# Patient Record
Sex: Male | Born: 1995 | Race: White | Hispanic: No | Marital: Single | Smoking: Never smoker
Health system: Southern US, Community
[De-identification: ages and names within clinical notes are randomized; demographics above are authoritative.]

---
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
## 2017-08-21 IMAGING — CR DG CLAVICLE*R*
2 series · 2 of 2 positions shown · non-contrast
Comparison: None.

CLINICAL DATA: Fell during martial arts practice 1 day prior

EXAM:
RIGHT CLAVICLE - 2+ VIEWS

[clavicle ap]
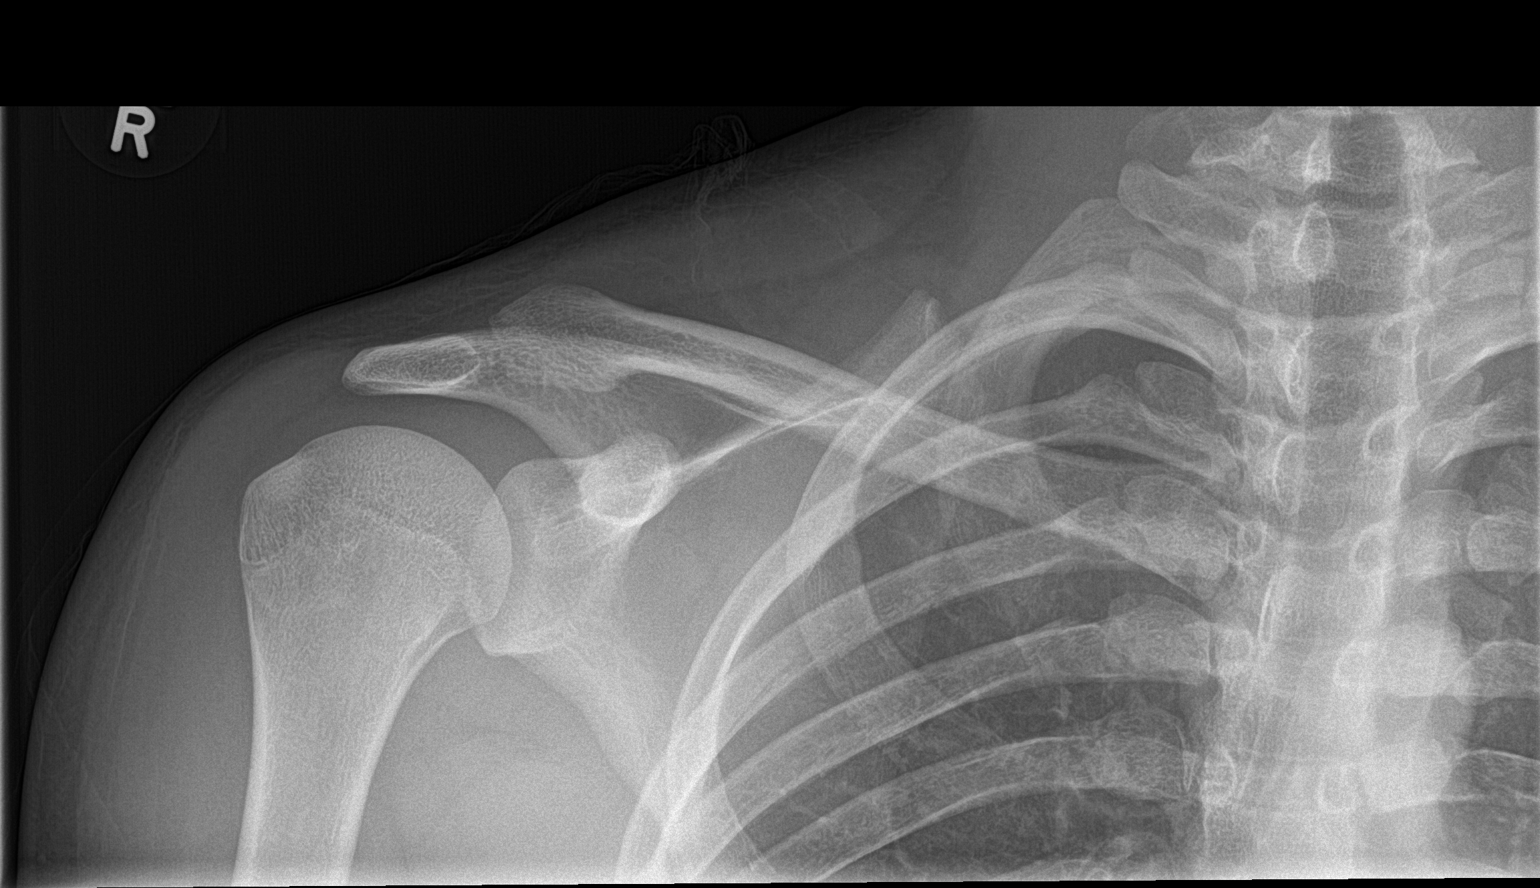

[clavicle axial]
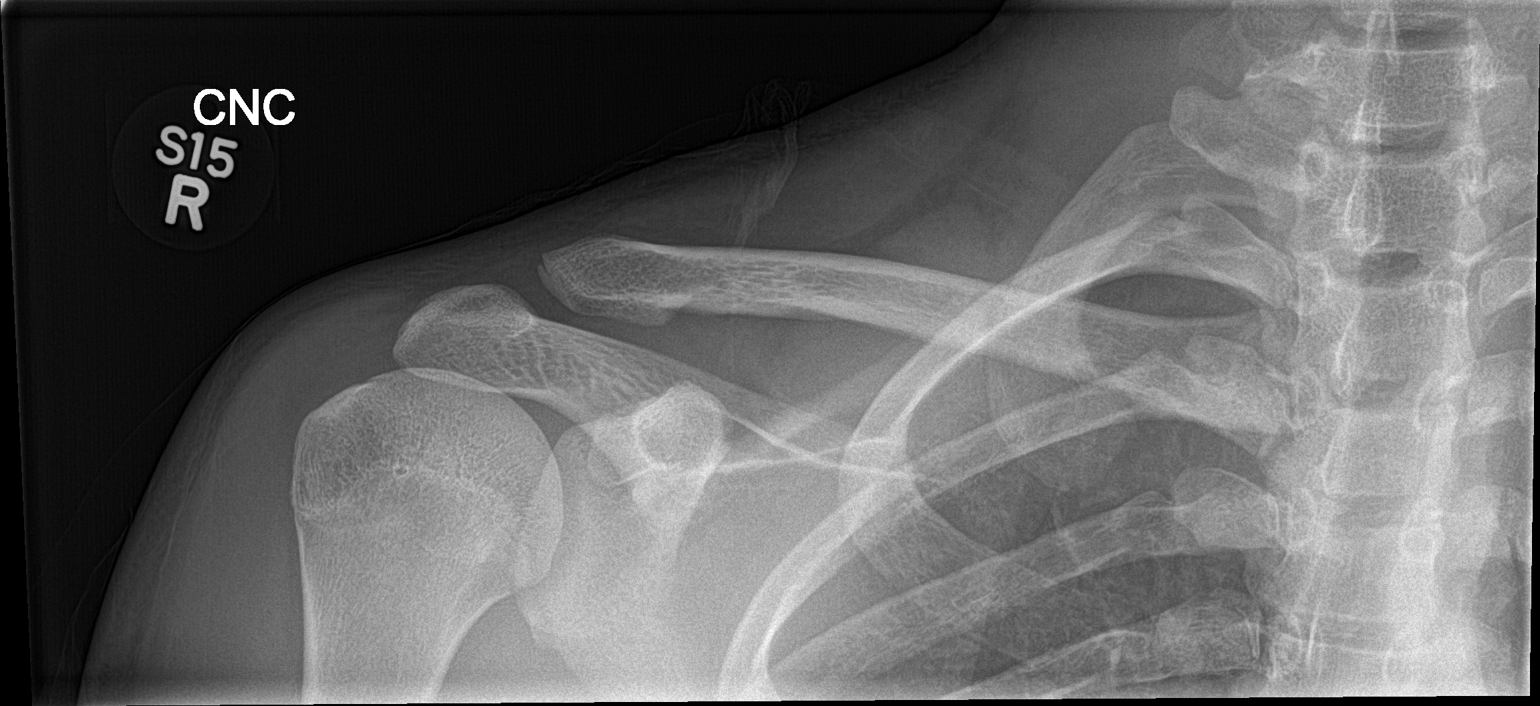

[2 of 2 positions shown; findings below may reference images not displayed]

FINDINGS: Frontal and tilt frontal images were obtained. There is no
demonstrable fracture or dislocation. No apparent arthropathy.
Visualized right lung clear.
IMPRESSION: No demonstrable fracture or dislocation. No appreciable arthropathic
change.

## 2017-10-14 ENCOUNTER — Emergency Department
Admission: EM | Admit: 2017-10-14 | Discharge: 2017-10-14 | Disposition: A | Discharge status: Home / Self Care | Payer: Self-pay | Attending: Emergency Medicine | Admitting: Emergency Medicine

## 2017-10-14 ENCOUNTER — Emergency Department: Payer: Self-pay

## 2017-10-14 ENCOUNTER — Other Ambulatory Visit: Payer: Self-pay

## 2017-10-14 DIAGNOSIS — Z79899 Other long term (current) drug therapy: Secondary | ICD-10-CM | POA: Insufficient documentation

## 2017-10-14 DIAGNOSIS — F17220 Nicotine dependence, chewing tobacco, uncomplicated: Secondary | ICD-10-CM | POA: Insufficient documentation

## 2017-10-14 DIAGNOSIS — R079 Chest pain, unspecified: Secondary | ICD-10-CM | POA: Insufficient documentation

## 2017-10-14 MED ORDER — MELOXICAM 15 MG PO TABS: 15.0000 mg | ORAL_TABLET | Freq: Every day | ORAL | 0 refills | Status: AC

## 2017-10-14 NOTE — ED Triage Notes (Signed)
Patient reports having chest pain off/on for the past 10 years, worse this time over the past few days.

## 2017-10-14 NOTE — ED Provider Notes (Signed)
[facility] Emergency Department Provider Note  ____________________________________________  Time seen: Approximately 9:42 PM  I have reviewed the triage vital signs and the nursing notes.   HISTORY  Chief Complaint Chest Pain    HPI Douglas Navarro is a 22 y.o. male who presents the emergency department complaining of chest pain.  Patient reports that he has had these intermittent episodes of chest pain for the past 10 years.  Patient reports that he has never seen a cardiologist for same.  They typically last for brief, short period of time and then resolve.  Patient was concerned as the episode today did not resolve as rapidly as previous episodes.  Patient reports that pain is all located on the left side of his chest.  Patient has had a recent URI denies any other recent illnesses.  No chronic medical problems.  No familial history of cardiac history.  Patient denies any history of murmurs, VSD or ASD, valvular issues.  No history of pericarditis or endocarditis.  No drug use.  Patient denies any other complaint of headache, visual changes, shortness of breath, abdominal pain, nausea vomiting, diarrhea constipation.  No medications for this complaint prior to arrival.  Patient is not currently experiencing chest pain.  No past medical history on file.  There are no active problems to display for this patient.   No past surgical history on file.  Prior to Admission medications   Medication Sig Start Date End Date Taking? Authorizing Provider  meloxicam (MOBIC) 15 MG tablet Take 1 tablet (15 mg total) by mouth daily. 10/14/17   Elea Holtzclaw, Delorise Royals, PA-C  naproxen (NAPROSYN) 500 MG tablet Take 1 tablet (500 mg total) by mouth 2 (two) times daily with a meal. 08/26/15   Joni Reining, PA-C    Allergies Patient has no known allergies.  No family history on file.  Social History Social History   Tobacco Use  . Smoking status: Never Smoker  . Smokeless  tobacco: Current User  Substance Use Topics  . Alcohol use: Yes  . Drug use: No     Review of Systems  Constitutional: No fever/chills Eyes: No visual changes. No discharge ENT: No upper respiratory complaints. Cardiovascular: Positive for left-sided chest pain Respiratory: no cough. No SOB. Gastrointestinal: No abdominal pain.  No nausea, no vomiting.  No diarrhea.  No constipation. Musculoskeletal: Negative for musculoskeletal pain. Skin: Negative for rash, abrasions, lacerations, ecchymosis. Neurological: Negative for headaches, focal weakness or numbness. 10-point ROS otherwise negative.  ____________________________________________   PHYSICAL EXAM:  VITAL SIGNS: ED Triage Vitals  Enc Vitals Group     BP 10/14/17 2048 133/78     Pulse Rate 10/14/17 2048 98     Resp 10/14/17 2048 16     Temp 10/14/17 2048 98.6 F (37 C)     Temp Source 10/14/17 2048 Oral     SpO2 10/14/17 2048 98 %     Weight 10/14/17 2047 165 lb (74.8 kg)     Height 10/14/17 2047 5\' 10"  (1.778 m)     Head Circumference --      Peak Flow --      Pain Score 10/14/17 2045 3     Pain Loc --      Pain Edu? --      Excl. in GC? --      Constitutional: Alert and oriented. Well appearing and in no acute distress. Eyes: Conjunctivae are normal. PERRL. EOMI. Head: Atraumatic. Neck: No stridor.  Neck is  supple with full range of motion  Cardiovascular: Normal rate, regular rhythm. Normal S1 and S2.  No murmurs, rubs, gallops.  Good peripheral circulation with pulses intact x4 extremities. Respiratory: Normal respiratory effort without tachypnea or retractions. Lungs CTAB. Good air entry to the bases with no decreased or absent breath sounds. Musculoskeletal: Full range of motion to all extremities. No gross deformities appreciated.  Palpation along the anterior chest wall reveals tenderness over the left sternal border.  Palpation in this area reproduces patient's chest pain.  No palpable  abnormality. Neurologic:  Normal speech and language. No gross focal neurologic deficits are appreciated.  Skin:  Skin is warm, dry and intact. No rash noted. Psychiatric: Mood and affect are normal. Speech and behavior are normal. Patient exhibits appropriate insight and judgement.   ____________________________________________   LABS (all labs ordered are listed, but only abnormal results are displayed)  Labs Reviewed - No data to display ____________________________________________  EKG  ED ECG REPORT I, Delorise RoyalsJonathan D Tallan Sandoz,  personally viewed and interpreted this ECG.   Date: 10/14/2017  EKG Time: 2049 hrs.  Rate: 101 bpm  Rhythm: sinus tachycardia  Axis: Normal axis  Intervals:none  ST&T Change: No ST elevation or T wave changes.  PR, QRS, QT interval within normal limits.  Sinus tachycardia.  Otherwise normal EKG.  ____________________________________________  RADIOLOGY Festus BarrenI, Karmello Abercrombie D Ehab Humber, personally viewed and evaluated these images (plain radiographs) as part of my medical decision making, as well as reviewing the written report by the radiologist.  I concur with radiologist of no acute cardiopulmonary abnormality.  Dg Chest 2 View  Result Date: 10/14/2017 CLINICAL DATA:  Chronic chest pain increased over the past few days EXAM: CHEST - 2 VIEW COMPARISON:  None. FINDINGS: The heart size and mediastinal contours are within normal limits. Both lungs are clear. The visualized skeletal structures are unremarkable. IMPRESSION: No active cardiopulmonary disease. Electronically Signed   By: Alcide CleverMark  Lukens M.D.   On: 10/14/2017 21:21    ____________________________________________    PROCEDURES  Procedure(s) performed:    Procedures        Medications - No data to display   ____________________________________________   INITIAL IMPRESSION / ASSESSMENT AND PLAN / ED COURSE  Pertinent labs & imaging results that were available during my care of the  patient were reviewed by me and considered in my medical decision making (see chart for details).  Review of the [place] CSRS was performed in accordance of the [facility] prior to dispensing any controlled drugs.     Patient's diagnosis is consistent with nonspecific chest pain.  Patient presents with left-sided chest pain.  Differential included rib fracture, costochondritis, spontaneous pneumothorax, pericarditis, endocarditis, MI, PE.  Patient reports a 10-year history of intermittent symptoms.  He has never seen cardiology for same.  No history of drug use, pericarditis, endocarditis.  Labs are not consistent with pericarditis or endocarditis.  Symptoms are not consistent with PE at this time.  Patient had recent URI symptoms but states that these have completely resolved at this time.  Exam is reassuring.  Chest x-ray reveals no acute cardiopulmonary abnormality.  Exam shows sinus tach at 101 bpm but otherwise normal EKG.  Patient did have reproducible symptoms with palpation along the left sternal border.  At this time, I highly doubt that patient has experienced costochondritis for 10 years.  But with reproducible pain with palpation, I feel that chest pain is much less likely cardiac related.  At this time, patient is symptom-free, EKG and  chest x-ray reassuring.  Exam is reassuring.  At this time, no indication for further workup to include further imaging and/or lab work.  Patient will be discharged with anti-inflammatory medication for possible costochondritis.  He is advised to follow-up with cardiology for further evaluation of 10-year history of intermittent chest pain. Patient is given ED precautions to return to the ED for any worsening or new symptoms.     ____________________________________________  FINAL CLINICAL IMPRESSION(S) / ED DIAGNOSES  Final diagnoses:  Nonspecific chest pain      NEW MEDICATIONS STARTED DURING THIS VISIT:  ED Discharge Orders        Ordered    meloxicam  (MOBIC) 15 MG tablet  Daily     10/14/17 2204          This chart was dictated using voice recognition software/Dragon. Despite best efforts to proofread, errors can occur which can change the meaning. Any change was purely unintentional.    Racheal Patches, PA-C 10/14/17 2214    Phineas Semen, MD 10/14/17 2253

## 2018-01-15 ENCOUNTER — Ambulatory Visit
Admission: EM | Admit: 2018-01-15 | Discharge: 2018-01-15 | Disposition: A | Discharge status: Home / Self Care | Payer: Self-pay | Attending: Family Medicine | Admitting: Family Medicine

## 2018-01-15 DIAGNOSIS — R112 Nausea with vomiting, unspecified: Secondary | ICD-10-CM

## 2018-01-15 DIAGNOSIS — E860 Dehydration: Secondary | ICD-10-CM

## 2018-01-15 DIAGNOSIS — R5383 Other fatigue: Secondary | ICD-10-CM

## 2018-01-15 DIAGNOSIS — R197 Diarrhea, unspecified: Secondary | ICD-10-CM

## 2018-01-15 MED ORDER — ONDANSETRON 8 MG PO TBDP: 8.0000 mg | ORAL_TABLET | Freq: Two times a day (BID) | ORAL | 0 refills | Status: AC

## 2018-01-15 MED ORDER — ONDANSETRON 8 MG PO TBDP
8.0000 mg | ORAL_TABLET | Freq: Once | ORAL | Status: AC
  Administered 2018-01-15: 8 mg via ORAL

## 2018-01-15 NOTE — Discharge Instructions (Signed)
Force fluids.  Use Tylenol 650mg  3-4 times daily as necessary for body aches or fever. If You run high fevers or not improving after rehydration , return to our clinic or go to the emergency room.  Likely a viral illness does not require antibiotics at this time.

## 2018-01-15 NOTE — ED Provider Notes (Addendum)
[facility]    CSN: [id number] Arrival date & time: 01/15/18  1949     History   Chief Complaint Chief Complaint  Patient presents with  . Fatigue    HPI Douglas Navarro is a 22 y.o. male.   HPI  23 year old male presents accompanied by his girlfriend complaining of fatigue body aches headache vomiting diarrhea and loss of appetite.  He is also extremely exhausted.  He states that he has been able to tolerate water but he vomits up most food.  He took Aleve earlier today and it did seem to stay down.  Dates that on Saturday or noticing headache then began to have body aches along with vomiting and diarrhea.  Today he states that he has had 10 episodes of loose watery diarrhea without blood or mucus.  Vomited about 4 times.  He states that he sweats when he is sleeping.  He ate at [employer] and fries day before the onset of his symptoms.  Nobody else in his party got sick.  Today vitals are: 99.4 temperature pulse rate of 109 blood pressure 124/60 O2 sats at 100%. Has  Generalized periumbilical abdominal pain prior to diarrhea or vomiting        History reviewed. No pertinent past medical history.  There are no active problems to display for this patient.   History reviewed. No pertinent surgical history.     Home Medications    Prior to Admission medications   Medication Sig Start Date End Date Taking? Authorizing Provider  meloxicam (MOBIC) 15 MG tablet Take 1 tablet (15 mg total) by mouth daily. 10/14/17   Cuthriell, Delorise Royals, PA-C  naproxen (NAPROSYN) 500 MG tablet Take 1 tablet (500 mg total) by mouth 2 (two) times daily with a meal. 08/26/15   Joni Reining, PA-C  ondansetron (ZOFRAN ODT) 8 MG disintegrating tablet Take 1 tablet (8 mg total) by mouth 2 (two) times daily. 01/15/18   Lutricia Feil, PA-C    Family History No family history on file.  Social History Social History   Tobacco Use  . Smoking status: Never Smoker  . Smokeless  tobacco: Current User  Substance Use Topics  . Alcohol use: Yes  . Drug use: No     Allergies   Patient has no known allergies.   Review of Systems Review of Systems  Constitutional: Positive for activity change, appetite change, chills, fatigue and fever.  Gastrointestinal: Positive for abdominal pain, diarrhea, nausea and vomiting.  All other systems reviewed and are negative.    Physical Exam Triage Vital Signs ED Triage Vitals  Enc Vitals Group     BP 01/15/18 2001 124/60     Pulse Rate 01/15/18 1959 (!) 109     Resp 01/15/18 1959 18     Temp 01/15/18 1959 99.4 F (37.4 C)     Temp Source 01/15/18 1959 Oral     SpO2 01/15/18 1959 100 %     Weight 01/15/18 2003 165 lb (74.8 kg)     Height --      Head Circumference --      Peak Flow --      Pain Score 01/15/18 2002 8     Pain Loc --      Pain Edu? --      Excl. in GC? --    No data found.  Updated Vital Signs BP 124/60   Pulse (!) 109   Temp 99.4 F (37.4 C) (Oral)  Resp 18   Wt 165 lb (74.8 kg)   SpO2 100%   BMI 23.68 kg/m   Visual Acuity Right Eye Distance:   Left Eye Distance:   Bilateral Distance:    Right Eye Near:   Left Eye Near:    Bilateral Near:     Physical Exam  Constitutional: He is oriented to person, place, and time. He appears well-developed and well-nourished. No distress.  HENT:  Head: Normocephalic.  Right Ear: External ear normal.  Left Ear: External ear normal.  Nose: Nose normal.  Mouth/Throat: Oropharynx is clear and moist. No oropharyngeal exudate.  Eyes: Pupils are equal, round, and reactive to light. Right eye exhibits no discharge. Left eye exhibits no discharge.  Neck: Normal range of motion.  Pulmonary/Chest: Effort normal and breath sounds normal.  Abdominal: Soft. Bowel sounds are normal. He exhibits no distension and no mass. There is no tenderness. There is no rebound and no guarding.  Musculoskeletal: Normal range of motion.  Lymphadenopathy:    He has no  cervical adenopathy.  Neurological: He is alert and oriented to person, place, and time.  Skin: Skin is warm and dry. He is not diaphoretic.  Psychiatric: He has a normal mood and affect. His behavior is normal. Judgment and thought content normal.  Nursing note and vitals reviewed.    UC Treatments / Results  Labs (all labs ordered are listed, but only abnormal results are displayed) Labs Reviewed - No data to display  EKG None  Radiology No results found.  Procedures Procedures (including critical care time)  Medications Ordered in UC Medications  ondansetron (ZOFRAN-ODT) disintegrating tablet 8 mg (8 mg Oral Given 01/15/18 2019)    Initial Impression / Assessment and Plan / UC Course  I have reviewed the triage vital signs and the nursing notes.  Pertinent labs & imaging results that were available during my care of the patient were reviewed by me and considered in my medical decision making (see chart for details).     Plan: 1. Test/x-ray results and diagnosis reviewed with patient 2. rx as per orders; risks, benefits, potential side effects reviewed with patient 3. Recommend supportive treatment with forcing fluids.  Will provide him with Zofran for nausea to enable him to rehydrate.  Told him is better to hydrate if he is able to tolerate it through his gut then through a veiin.  Most likely that this is a viral gastroenteritis.  Biotics would not be effective at this point.  He will take Tylenol 650 mg 3-4 times daily as necessary for body aches or fever.  I have told him that if he runs high fevers or has intractable nausea vomiting or diarrhea he should return to our clinic go to the emergency room for further evaluation.  He was given Zofran 8 mg ODT in our office in our clinic prior to leaving 4. F/u prn if symptoms worsen or don't improve  Final Clinical Impressions(s) / UC Diagnoses   Final diagnoses:  Other fatigue  Dehydration  Non-intractable vomiting with  nausea, unspecified vomiting type  Diarrhea of presumed infectious origin     Discharge Instructions     Force fluids.  Use Tylenol 650mg  3-4 times daily as necessary for body aches or fever. If You run high fevers or not improving after rehydration , return to our clinic or go to the emergency room.  Likely a viral illness does not require antibiotics at this time.   ED Prescriptions    Medication  Sig Dispense Auth. Provider   ondansetron (ZOFRAN ODT) 8 MG disintegrating tablet Take 1 tablet (8 mg total) by mouth 2 (two) times daily. 6 tablet Lutricia Feiloemer, Nick Stults P, PA-C     Controlled Substance Prescriptions [place] Controlled Substance Registry consulted? Not Applicable   Lutricia FeilRoemer, Yanelly Cantrelle P, PA-C 01/15/18 2030    Lutricia Feiloemer, Kurstin Dimarzo P, PA-C 01/15/18 2032

## 2018-01-15 NOTE — ED Triage Notes (Signed)
Since Sunday pt reports. Fatigue, body aches, headache, vomiting, diarrhea, loss of appetite along with extreme exhaustion. Water stays down but food doesn't. Did take aleve without relief.

## 2018-11-23 ENCOUNTER — Emergency Department
Admission: EM | Admit: 2018-11-23 | Discharge: 2018-11-23 | Disposition: A | Discharge status: Home / Self Care | Payer: HRSA Program | Attending: Emergency Medicine | Admitting: Emergency Medicine

## 2018-11-23 ENCOUNTER — Encounter: Payer: Self-pay | Admitting: Emergency Medicine

## 2018-11-23 ENCOUNTER — Other Ambulatory Visit: Payer: Self-pay

## 2018-11-23 ENCOUNTER — Emergency Department: Payer: HRSA Program

## 2018-11-23 DIAGNOSIS — B9789 Other viral agents as the cause of diseases classified elsewhere: Secondary | ICD-10-CM | POA: Diagnosis not present

## 2018-11-23 DIAGNOSIS — J069 Acute upper respiratory infection, unspecified: Secondary | ICD-10-CM | POA: Insufficient documentation

## 2018-11-23 DIAGNOSIS — Z20828 Contact with and (suspected) exposure to other viral communicable diseases: Secondary | ICD-10-CM | POA: Insufficient documentation

## 2018-11-23 DIAGNOSIS — R05 Cough: Secondary | ICD-10-CM | POA: Diagnosis present

## 2018-11-23 NOTE — ED Provider Notes (Signed)
[facility] Emergency Department Provider Note   ____________________________________________   First MD Initiated Contact with Patient 11/23/18 1012     (approximate)  I have reviewed the triage vital signs and the nursing notes.   HISTORY  Chief Complaint Cough    HPI Douglas Navarro is a 23 y.o. male here for evaluation of cough  Patient reports about 1 week ago he started develop a dry cough and some slight nasal congestion.  He went to work and they told him that they could not have him continue to work as he was coughing, they also noted that another employee that he works with was diagnosed with coronavirus.  Patient reports he would like a coronavirus test.  He denies any shortness of breath.  He does cough frequently and is dry.  No chest pain.  No nausea vomiting.  No fevers or chills.  Reports just cough some nasal congestion present for about a week not getting worse     History reviewed. No pertinent past medical history.  There are no active problems to display for this patient.   History reviewed. No pertinent surgical history.  Prior to Admission medications   Medication Sig Start Date End Date Taking? Authorizing Provider  meloxicam (MOBIC) 15 MG tablet Take 1 tablet (15 mg total) by mouth daily. Patient not taking: Reported on 11/23/2018 10/14/17   Cuthriell, Delorise Royals, PA-C  naproxen (NAPROSYN) 500 MG tablet Take 1 tablet (500 mg total) by mouth 2 (two) times daily with a meal. Patient not taking: Reported on 11/23/2018 08/26/15   Joni Reining, PA-C  ondansetron (ZOFRAN ODT) 8 MG disintegrating tablet Take 1 tablet (8 mg total) by mouth 2 (two) times daily. Patient not taking: Reported on 11/23/2018 01/15/18   Lutricia Feil, PA-C    Allergies Patient has no known allergies.  No family history on file.  Social History Social History   Tobacco Use  . Smoking status: Never Smoker  . Smokeless tobacco: Current User   Substance Use Topics  . Alcohol use: Yes  . Drug use: No    Review of Systems Constitutional: No fever/chills Eyes: No visual changes. ENT: No sore throat.  Some nasal congestion. Cardiovascular: Denies chest pain. Respiratory: Denies shortness of breath.  Positive for dry cough. Gastrointestinal: No abdominal pain.   Genitourinary: Negative for dysuria. Musculoskeletal: Negative for back pain. Skin: Negative for rash. Neurological: Negative for headaches, areas of focal weakness or numbness.    ____________________________________________   PHYSICAL EXAM:  VITAL SIGNS: ED Triage Vitals  Enc Vitals Group     BP 11/23/18 0944 116/68     Pulse Rate 11/23/18 0944 (!) 101     Resp 11/23/18 0944 20     Temp 11/23/18 0944 98.6 F (37 C)     Temp Source 11/23/18 0944 Oral     SpO2 11/23/18 0944 99 %     Weight 11/23/18 0945 173 lb (78.5 kg)     Height 11/23/18 0945 5\' 10"  (1.778 m)     Head Circumference --      Peak Flow --      Pain Score 11/23/18 0944 0     Pain Loc --      Pain Edu? --      Excl. in GC? --     Constitutional: Alert and oriented. Well appearing and in no acute distress.  Very pleasant.  Does have an occasional dry cough but no evidence shortness of breath.  Eyes: Conjunctivae are normal. Head: Atraumatic. Nose: Very slight nasal rhinorrhea. Mouth/Throat: Mucous membranes are moist. Neck: No stridor.  Cardiovascular: Normal rate, regular rhythm. Grossly normal heart sounds.  Good peripheral circulation. Respiratory: Normal respiratory effort.  No retractions. Lungs CTAB.  Full and clear respirations.  Lungs completely clear. Gastrointestinal: Soft and nontender. No distention. Musculoskeletal: No lower extremity tenderness nor edema. Neurologic:  Normal speech and language. No gross focal neurologic deficits are appreciated.  Skin:  Skin is warm, dry and intact. No rash noted. Psychiatric: Mood and affect are normal. Speech and behavior are normal.   ____________________________________________   LABS (all labs ordered are listed, but only abnormal results are displayed)  Labs Reviewed  NOVEL CORONAVIRUS, NAA (HOSPITAL ORDER, SEND-OUT TO REF LAB)   ____________________________________________  EKG   ____________________________________________  RADIOLOGY  Dg Chest Portable 1 View  Result Date: 11/23/2018 CLINICAL DATA:  Cough, fever, possible correlate exposure EXAM: PORTABLE CHEST 1 VIEW COMPARISON:  10/14/2017 FINDINGS: Lungs are clear.  No pleural effusion or pneumothorax. The heart is normal in size. IMPRESSION: No evidence of acute cardiopulmonary disease. Electronically Signed   By: Charline BillsSriyesh  Krishnan M.D.   On: 11/23/2018 11:14    X-ray reviewed negative for infiltrate ____________________________________________   PROCEDURES  Procedure(s) performed: None  Procedures  Critical Care performed: No  ____________________________________________   INITIAL IMPRESSION / ASSESSMENT AND PLAN / ED COURSE  Pertinent labs & imaging results that were available during my care of the patient were reviewed by me and considered in my medical decision making (see chart for details).   Patient is for evaluation of upper respiratory symptoms.  Very reassuring examination, however does report a coworker that he is close contact with was diagnosed with COVID-19.  He is well-appearing, nontoxic without any signs or symptoms suggest sepsis or need for hospitalization.  No cardiac symptoms.  No signs or symptoms of pulmonary embolism pneumothorax pneumonia etc.  Discussed with the patient, he will stay out of work as we send a COVID-19 test.  Given and discussed COVID-19 precautions in the event he does have it, he will continue to self quarantine for the interim.  Douglas Navarro was evaluated in Emergency Department on 11/23/2018 for the symptoms described in the history of present illness. He was evaluated in the context of the  global COVID-19 pandemic, which necessitated consideration that the patient might be at risk for infection with the SARS-CoV-2 virus that causes COVID-19. Institutional protocols and algorithms that pertain to the evaluation of patients at risk for COVID-19 are in a state of rapid change based on information released by regulatory bodies including the CDC and federal and state organizations. These policies and algorithms were followed during the patient's care in the ED.  Return precautions and treatment recommendations and follow-up discussed with the patient who is agreeable with the plan.       ____________________________________________   FINAL CLINICAL IMPRESSION(S) / ED DIAGNOSES  Final diagnoses:  Viral URI with cough        Note:  This document was prepared using Dragon voice recognition software and may include unintentional dictation errors       Sharyn CreamerQuale, Xanthe Couillard, MD 11/23/18 1200

## 2018-11-23 NOTE — ED Triage Notes (Signed)
Cough and congestion x 1 week, has been around someone diagnosed with COVID.

## 2018-11-23 NOTE — Discharge Instructions (Addendum)
Person Under Monitoring Name: Douglas Navarro  Location: [address] [place] [address]   Infection Prevention Recommendations for Individuals Confirmed to have, or Being Evaluated for, 2019 Novel Coronavirus (COVID-19) Infection Who Receive Care at Home  Individuals who are confirmed to have, or are being evaluated for, COVID-19 should follow the prevention steps below until a healthcare provider or local or state health department says they can return to normal activities.  Stay home except to get medical care You should restrict activities outside your home, except for getting medical care. Do not go to work, school, or public areas, and do not use public transportation or taxis.  Call ahead before visiting your doctor Before your medical appointment, call the healthcare provider and tell them that you have, or are being evaluated for, COVID-19 infection. This will help the healthcare providers office take steps to keep other people from getting infected. Ask your healthcare provider to call the local or state health department.  Monitor your symptoms Seek prompt medical attention if your illness is worsening (e.g., difficulty breathing). Before going to your medical appointment, call the healthcare provider and tell them that you have, or are being evaluated for, COVID-19 infection. Ask your healthcare provider to call the local or state health department.  Wear a facemask You should wear a facemask that covers your nose and mouth when you are in the same room with other people and when you visit a healthcare provider. People who live with or visit you should also wear a facemask while they are in the same room with you.  Separate yourself from other people in your home As much as possible, you should stay in a different room from other people in your home. Also, you should use a separate bathroom, if available.  Avoid sharing household items You should not  share dishes, drinking glasses, cups, eating utensils, towels, bedding, or other items with other people in your home. After using these items, you should wash them thoroughly with soap and water.  Cover your coughs and sneezes Cover your mouth and nose with a tissue when you cough or sneeze, or you can cough or sneeze into your sleeve. Throw used tissues in a lined trash can, and immediately wash your hands with soap and water for at least 20 seconds or use an alcohol-based hand rub.  Wash your [employer] your hands often and thoroughly with soap and water for at least 20 seconds. You can use an alcohol-based hand sanitizer if soap and water are not available and if your hands are not visibly dirty. Avoid touching your eyes, nose, and mouth with unwashed hands.   Prevention Steps for Caregivers and Household Members of Individuals Confirmed to have, or Being Evaluated for, COVID-19 Infection Being Cared for in the Home  If you live with, or provide care at home for, a person confirmed to have, or being evaluated for, COVID-19 infection please follow these guidelines to prevent infection:  Follow healthcare providers instructions Make sure that you understand and can help the patient follow any healthcare provider instructions for all care.  Provide for the patients basic needs You should help the patient with basic needs in the home and provide support for getting groceries, prescriptions, and other personal needs.  Monitor the patients symptoms If they are getting sicker, call his or her medical provider and tell them that the patient has, or is being evaluated for, COVID-19 infection. This will help the healthcare  providers office take steps to keep other people from getting infected. Ask the healthcare provider to call the local or state health department.  Limit the number of people who have contact with the patient If possible, have only one caregiver for the  patient. Other household members should stay in another home or place of residence. If this is not possible, they should stay in another room, or be separated from the patient as much as possible. Use a separate bathroom, if available. Restrict visitors who do not have an essential need to be in the home.  Keep older adults, very young children, and other sick people away from the patient Keep older adults, very young children, and those who have compromised immune systems or chronic health conditions away from the patient. This includes people with chronic heart, lung, or kidney conditions, diabetes, and cancer.  Ensure good ventilation Make sure that shared spaces in the home have good air flow, such as from an air conditioner or an opened window, weather permitting.  Wash your hands often Wash your hands often and thoroughly with soap and water for at least 20 seconds. You can use an alcohol based hand sanitizer if soap and water are not available and if your hands are not visibly dirty. Avoid touching your eyes, nose, and mouth with unwashed hands. Use disposable paper towels to dry your hands. If not available, use dedicated cloth towels and replace them when they become wet.  Wear a facemask and gloves Wear a disposable facemask at all times in the room and gloves when you touch or have contact with the patients blood, body fluids, and/or secretions or excretions, such as sweat, saliva, sputum, nasal mucus, vomit, urine, or feces.  Ensure the mask fits over your nose and mouth tightly, and do not touch it during use. Throw out disposable facemasks and gloves after using them. Do not reuse. Wash your hands immediately after removing your facemask and gloves. If your personal clothing becomes contaminated, carefully remove clothing and launder. Wash your hands after handling contaminated clothing. Place all used disposable facemasks, gloves, and other waste in a lined container before  disposing them with other household waste. Remove gloves and wash your hands immediately after handling these items.  Do not share dishes, glasses, or other household items with the patient Avoid sharing household items. You should not share dishes, drinking glasses, cups, eating utensils, towels, bedding, or other items with a patient who is confirmed to have, or being evaluated for, COVID-19 infection. After the person uses these items, you should wash them thoroughly with soap and water.  Wash laundry thoroughly Immediately remove and wash clothes or bedding that have blood, body fluids, and/or secretions or excretions, such as sweat, saliva, sputum, nasal mucus, vomit, urine, or feces, on them. Wear gloves when handling laundry from the patient. Read and follow directions on labels of laundry or clothing items and detergent. In general, wash and dry with the warmest temperatures recommended on the label.  Clean all areas the individual has used often Clean all touchable surfaces, such as counters, tabletops, doorknobs, bathroom fixtures, toilets, phones, keyboards, tablets, and bedside tables, every day. Also, clean any surfaces that may have blood, body fluids, and/or secretions or excretions on them. Wear gloves when cleaning surfaces the patient has come in contact with. Use a diluted bleach solution (e.g., dilute bleach with 1 part bleach and 10 parts water) or a household disinfectant with a label that says EPA-registered for coronaviruses. To make  a bleach solution at home, add 1 tablespoon of bleach to 1 quart (4 cups) of water. For a larger supply, add  cup of bleach to 1 gallon (16 cups) of water. Read labels of cleaning products and follow recommendations provided on product labels. Labels contain instructions for safe and effective use of the cleaning product including precautions you should take when applying the product, such as wearing gloves or eye protection and making sure you  have good ventilation during use of the product. Remove gloves and wash hands immediately after cleaning.  Monitor yourself for signs and symptoms of illness Caregivers and household members are considered close contacts, should monitor their health, and will be asked to limit movement outside of the home to the extent possible. Follow the monitoring steps for close contacts listed on the symptom monitoring form.   ? If you have additional questions, contact your local health department or call the epidemiologist on call at [phone] (available 24/7). ? This guidance is subject to change. For the most up-to-date guidance from [facility], please refer to their website: [email or web address].pl

## 2018-11-25 LAB — NOVEL CORONAVIRUS, NAA (HOSP ORDER, SEND-OUT TO REF LAB; TAT 18-24 HRS): SARS-CoV-2, NAA: NOT DETECTED

## 2019-10-10 IMAGING — CR DG CHEST 2V
1 series · 2 of 2 positions shown · non-contrast
Comparison: None.

CLINICAL DATA: Chronic chest pain increased over the past few days

EXAM:
CHEST - 2 VIEW

[Series 1: dg chest 2 view · 0.14mm/px · 2 of 2 slices shown]
[im 1/2]
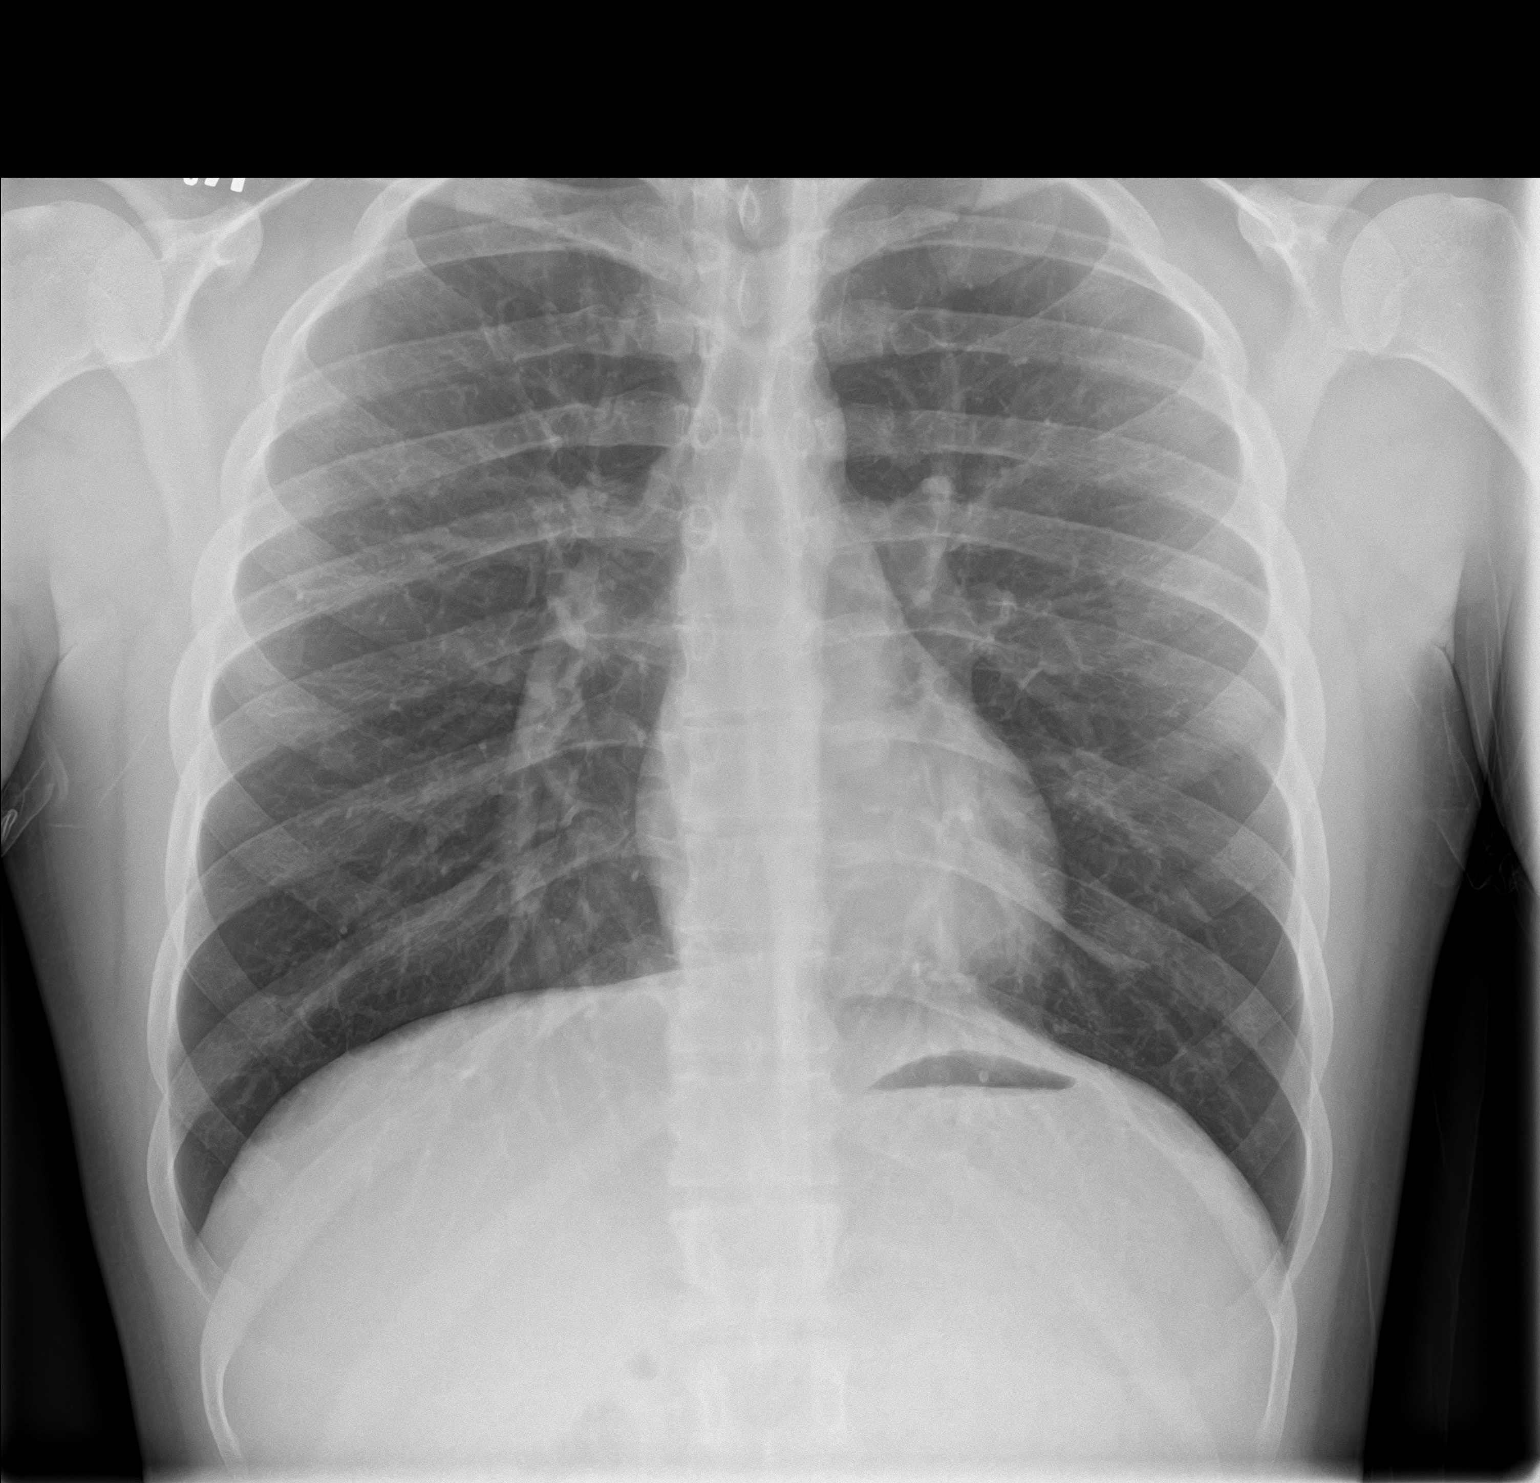
[im 2/2]
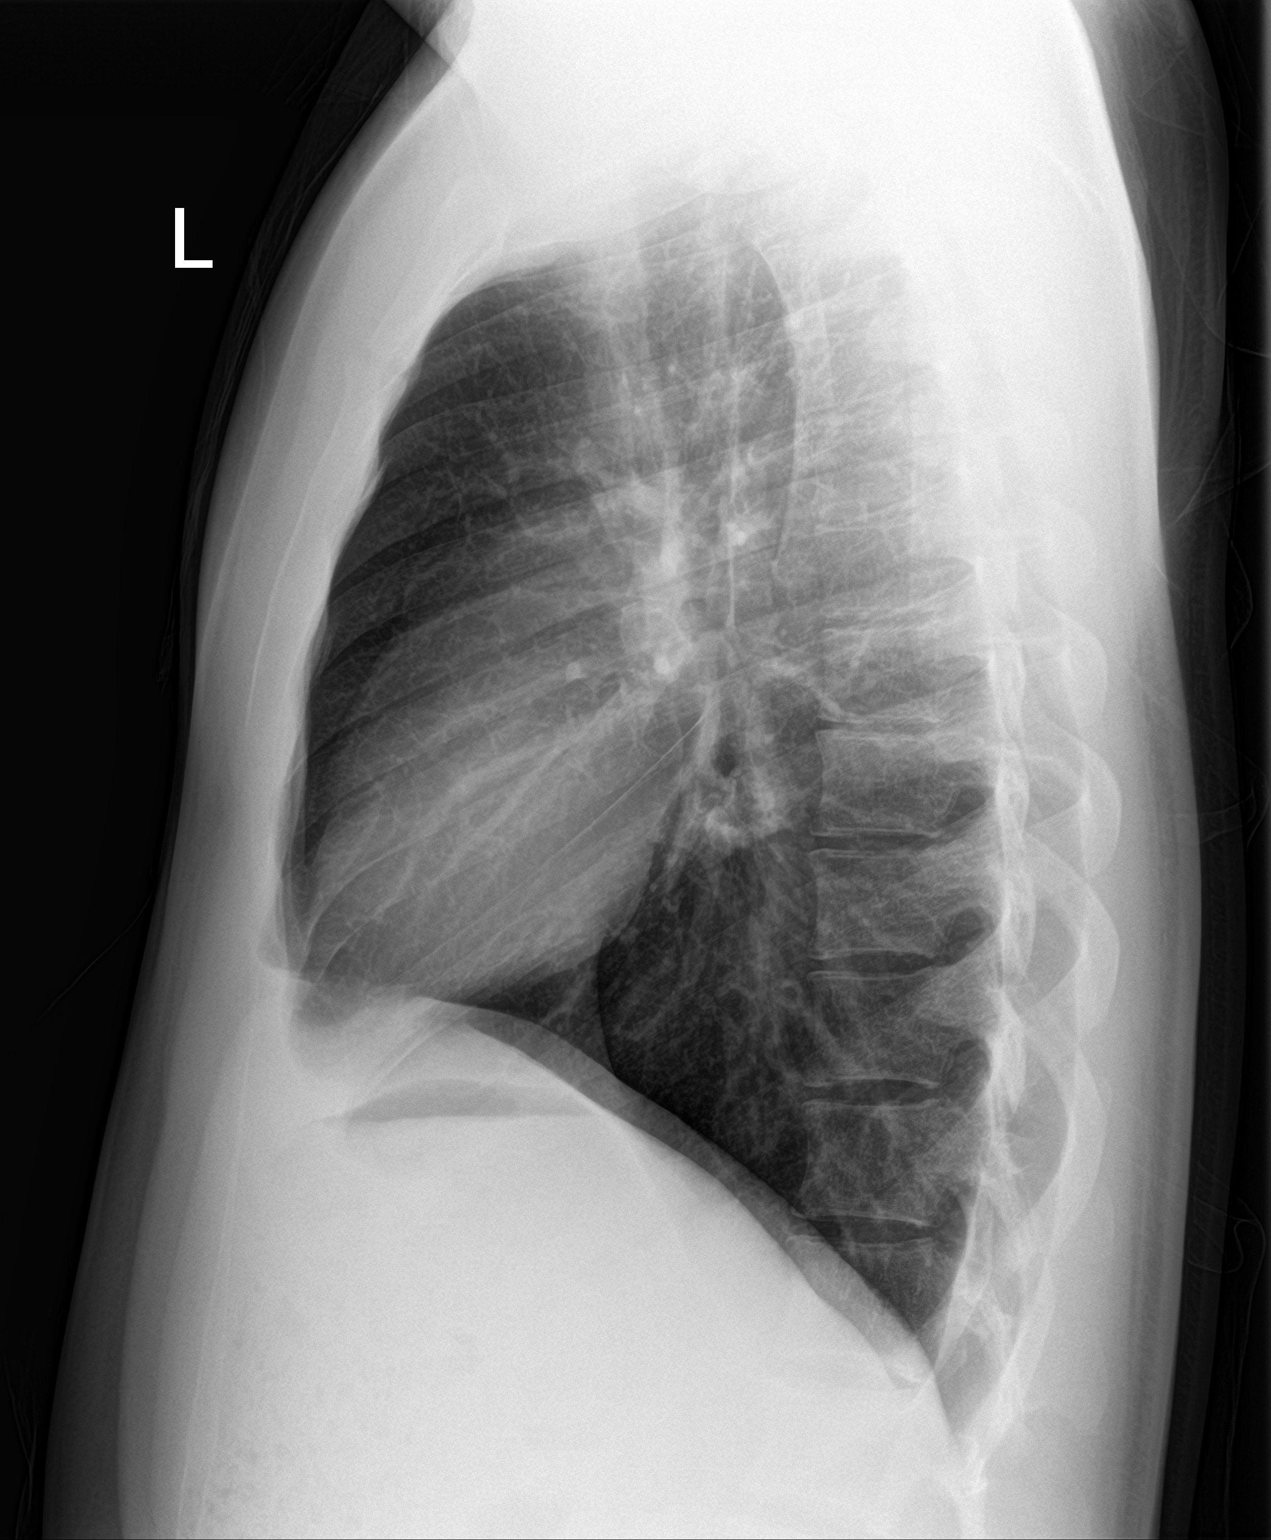

[2 of 2 positions shown; findings below may reference images not displayed]

FINDINGS: The heart size and mediastinal contours are within normal limits.
Both lungs are clear. The visualized skeletal structures are
unremarkable.
IMPRESSION: No active cardiopulmonary disease.

## 2020-11-18 IMAGING — DX PORTABLE CHEST - 1 VIEW
1 series · 1 of 1 positions shown · non-contrast
Comparison: 10/14/2017

CLINICAL DATA: Cough, fever, possible correlate exposure

EXAM:
PORTABLE CHEST 1 VIEW

[chest ap]
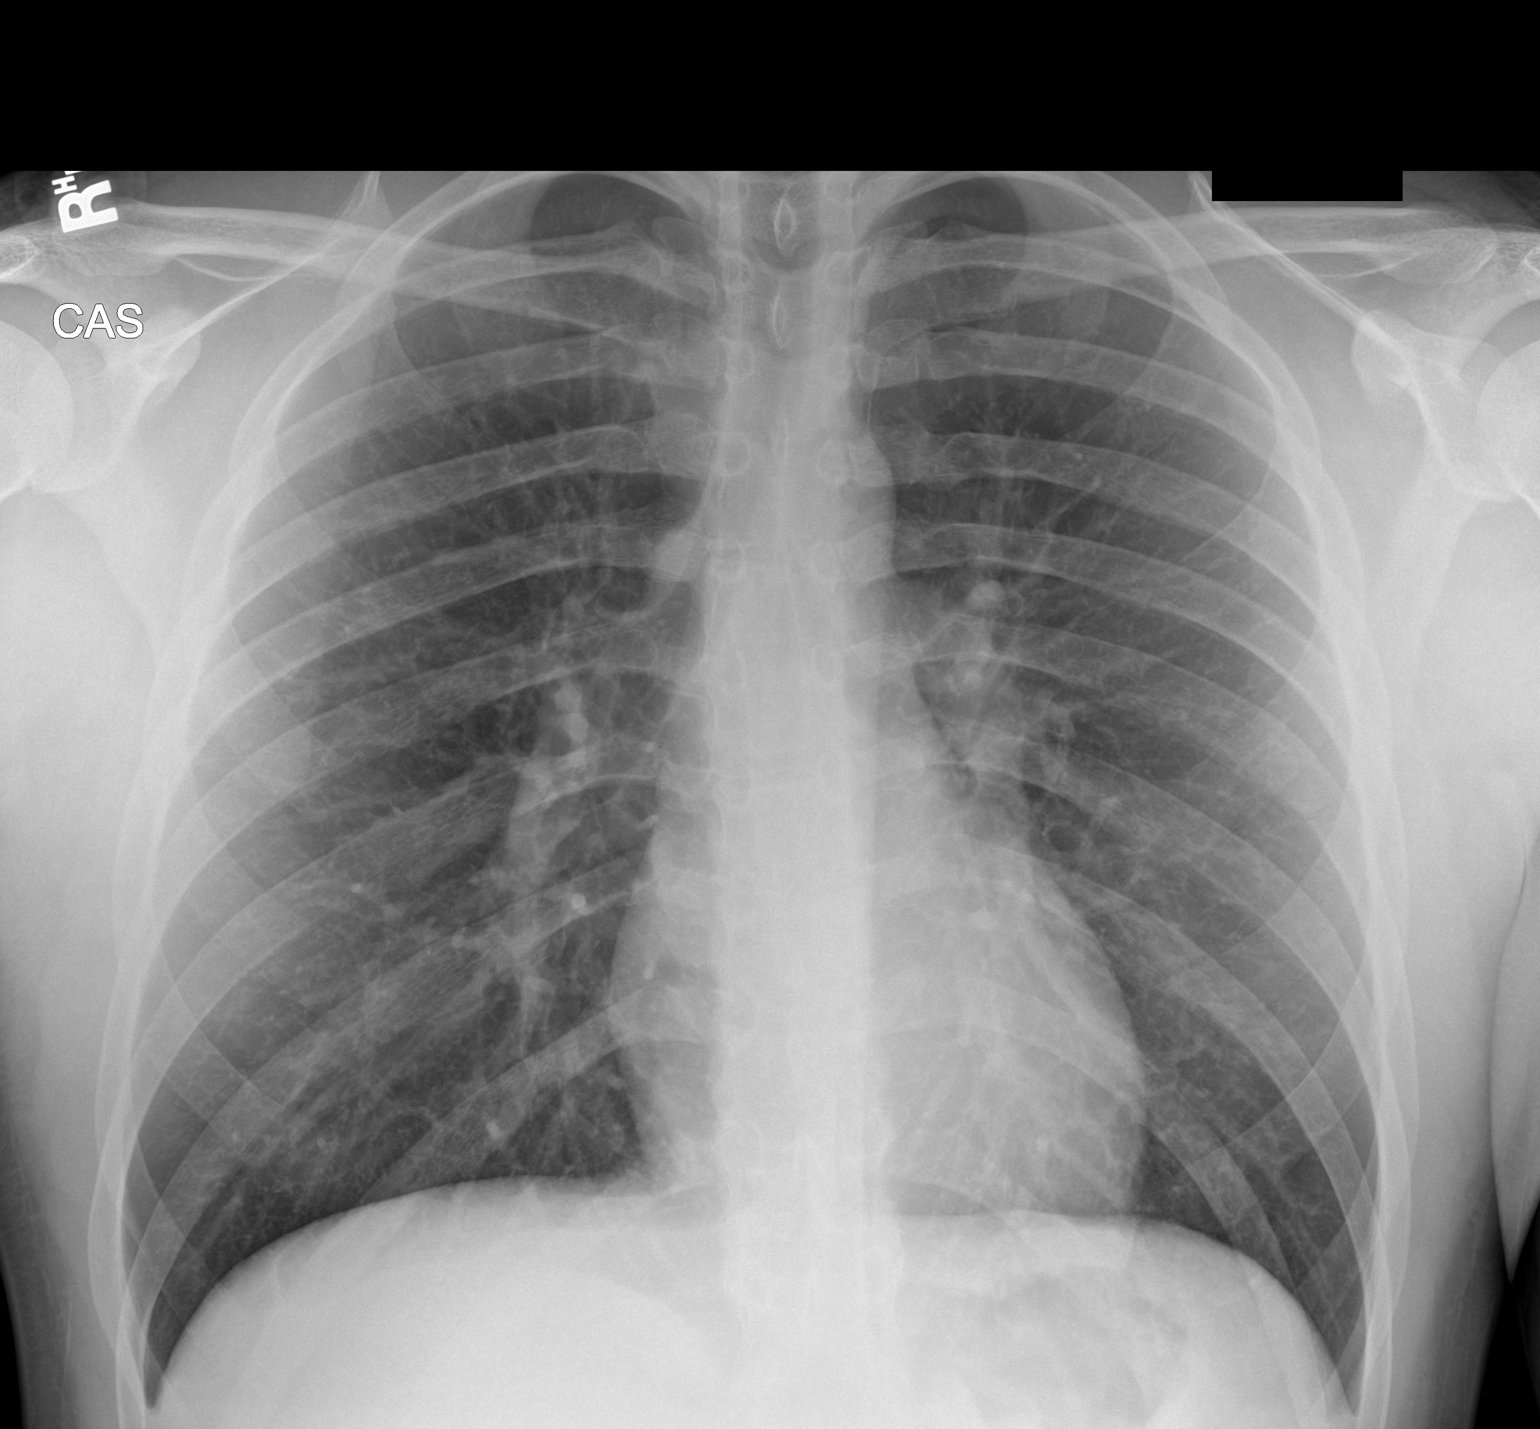

[1 of 1 positions shown; findings below may reference images not displayed]

FINDINGS: Lungs are clear.  No pleural effusion or pneumothorax.

The heart is normal in size.
IMPRESSION: No evidence of acute cardiopulmonary disease.

## 2023-06-12 ENCOUNTER — Ambulatory Visit
Admission: EM | Admit: 2023-06-12 | Discharge: 2023-06-12 | Disposition: A | Discharge status: Home / Self Care | Attending: Emergency Medicine | Admitting: Emergency Medicine

## 2023-06-12 ENCOUNTER — Encounter: Payer: Self-pay | Admitting: Emergency Medicine

## 2023-06-12 DIAGNOSIS — R6889 Other general symptoms and signs: Secondary | ICD-10-CM

## 2023-06-12 DIAGNOSIS — J069 Acute upper respiratory infection, unspecified: Secondary | ICD-10-CM | POA: Diagnosis not present

## 2023-06-12 LAB — RESP PANEL BY RT-PCR (FLU A&B, COVID) ARPGX2
Influenza A by PCR: NEGATIVE
Influenza B by PCR: NEGATIVE
SARS Coronavirus 2 by RT PCR: NEGATIVE

## 2023-06-12 LAB — GROUP A STREP BY PCR: Group A Strep by PCR: NOT DETECTED

## 2023-06-12 MED ORDER — PROMETHAZINE-DM 6.25-15 MG/5ML PO SYRP: 5.0000 mL | ORAL_SOLUTION | Freq: Three times a day (TID) | ORAL | 0 refills | Status: AC | PRN

## 2023-06-12 MED ORDER — ACETAMINOPHEN 325 MG PO TABS
975.0000 mg | ORAL_TABLET | Freq: Four times a day (QID) | ORAL | Status: DC | PRN
  Administered 2023-06-12: 975 mg via ORAL

## 2023-06-12 MED ORDER — IBUPROFEN 600 MG PO TABS: 600.0000 mg | ORAL_TABLET | Freq: Once | ORAL | Status: DC

## 2023-06-12 NOTE — ED Provider Notes (Signed)
[facility]    CSN: [id number] Arrival date & time: 06/12/23  1723      History   Chief Complaint Chief Complaint  Patient presents with   Shortness of Breath   Headache   Nasal Congestion   Cough   Sore Throat    HPI Douglas Navarro is a 27 y.o. male.   Douglas Navarro, 27 year old male patient, presents to urgent care for evaluation of cough, runny nose, chest congestion, short of breath , symptoms started yesterday.  Patient states he took DayQuil for his symptoms this morning, patient received Tylenol ibuprofen in office.  The history is provided by the patient. No language interpreter was used.    History reviewed. No pertinent past medical history.  Patient Active Problem List   Diagnosis Date Noted   Flu-like symptoms 06/12/2023   Viral URI with cough 06/12/2023    History reviewed. No pertinent surgical history.     Home Medications    Prior to Admission medications   Medication Sig Start Date End Date Taking? Authorizing Provider  promethazine-dextromethorphan (PROMETHAZINE-DM) 6.25-15 MG/5ML syrup Take 5 mLs by mouth every 8 (eight) hours as needed. 06/12/23  Yes Adjoa Althouse, Para March, NP  meloxicam (MOBIC) 15 MG tablet Take 1 tablet (15 mg total) by mouth daily. Patient not taking: Reported on 11/23/2018 10/14/17   Cuthriell, Delorise Royals, PA-C  naproxen (NAPROSYN) 500 MG tablet Take 1 tablet (500 mg total) by mouth 2 (two) times daily with a meal. Patient not taking: Reported on 11/23/2018 08/26/15   Joni Reining, PA-C  ondansetron (ZOFRAN ODT) 8 MG disintegrating tablet Take 1 tablet (8 mg total) by mouth 2 (two) times daily. Patient not taking: Reported on 11/23/2018 01/15/18   Lutricia Feil, PA-C    Family History No family history on file.  Social History Social History   Tobacco Use   Smoking status: Never   Smokeless tobacco: Current  Vaping Use   Vaping status: Some Days  Substance Use Topics   Alcohol use: Yes   Drug use: No      Allergies   Doxycycline   Review of Systems Review of Systems  All other systems reviewed and are negative.    Physical Exam Triage Vital Signs ED Triage Vitals  Encounter Vitals Group     BP 06/12/23 1754 (!) 115/53     Systolic BP Percentile --      Diastolic BP Percentile --      Pulse Rate 06/12/23 1754 (!) 110     Resp 06/12/23 1754 18     Temp 06/12/23 1754 (!) 101 F (38.3 C)     Temp Source 06/12/23 1754 Oral     SpO2 06/12/23 1754 100 %     Weight --      Height --      Head Circumference --      Peak Flow --      Pain Score 06/12/23 1753 3     Pain Loc --      Pain Education --      Exclude from Growth Chart --    No data found.  Updated Vital Signs BP (!) 115/53 (BP Location: Right Arm)   Pulse 98   Temp 99.5 F (37.5 C) (Oral)   Resp 18   SpO2 100%   Visual Acuity Right Eye Distance:   Left Eye Distance:   Bilateral Distance:    Right Eye Near:   Left Eye Near:    Bilateral  Near:     Physical Exam Vitals and nursing note reviewed.  Constitutional:      General: He is not in acute distress.    Appearance: He is well-developed. He is not ill-appearing or toxic-appearing.  HENT:     Head: Normocephalic.     Right Ear: Tympanic membrane is retracted.     Left Ear: Tympanic membrane is retracted.     Nose: Mucosal edema and congestion present.     Mouth/Throat:     Mouth: Mucous membranes are moist.     Pharynx: Uvula midline.  Eyes:     General: Lids are normal.     Conjunctiva/sclera: Conjunctivae normal.     Pupils: Pupils are equal, round, and reactive to light.  Cardiovascular:     Rate and Rhythm: Normal rate and regular rhythm.     Heart sounds: Normal heart sounds.  Pulmonary:     Effort: Pulmonary effort is normal. No respiratory distress.     Breath sounds: Normal breath sounds. No decreased breath sounds or wheezing.  Abdominal:     General: There is no distension.     Palpations: Abdomen is soft.  Musculoskeletal:         General: Normal range of motion.     Cervical back: Normal range of motion.  Skin:    General: Skin is warm and dry.     Findings: No rash.  Neurological:     General: No focal deficit present.     Mental Status: He is alert and oriented to person, place, and time.     GCS: GCS eye subscore is 4. GCS verbal subscore is 5. GCS motor subscore is 6.     Cranial Nerves: No cranial nerve deficit.     Sensory: No sensory deficit.  Psychiatric:        Speech: Speech normal.        Behavior: Behavior normal. Behavior is cooperative.      UC Treatments / Results  Labs (all labs ordered are listed, but only abnormal results are displayed) Labs Reviewed  RESP PANEL BY RT-PCR (FLU A&B, COVID) ARPGX2  GROUP A STREP BY PCR    EKG   Radiology No results found.  Procedures Procedures (including critical care time)  Medications Ordered in UC Medications  acetaminophen (TYLENOL) tablet 975 mg (975 mg Oral Given 06/12/23 1800)  ibuprofen (ADVIL) tablet 600 mg (600 mg Oral Not Given 06/12/23 1849)    Initial Impression / Assessment and Plan / UC Course  I have reviewed the triage vital signs and the nursing notes.  Pertinent labs & imaging results that were available during my care of the patient were reviewed by me and considered in my medical decision making (see chart for details).    Discussed exam findings and plan of care with patient, go to ER precautions given, work note given, patient verbalized understanding this provider  Ddx: Viral URI with cough, flulike symptoms Final Clinical Impressions(s) / UC Diagnoses   Final diagnoses:  Flu-like symptoms  Viral URI with cough     Discharge Instructions      Your COVID flu and strep are all negative.  Rest, push fluids. Most likely you have a viral illness: no antibiotic is indicated at this time, May treat with OTC meds of choice. Make sure to drink plenty of fluids to stay hydrated(gatorade, water,  popsicles,jello,etc), avoid caffeine products. Follow up with PCP. Return as needed.     ED Prescriptions  Medication Sig Dispense Auth. Provider   promethazine-dextromethorphan (PROMETHAZINE-DM) 6.25-15 MG/5ML syrup Take 5 mLs by mouth every 8 (eight) hours as needed. 118 mL Justyce Baby, Para March, NP      PDMP not reviewed this encounter.   Clancy Gourd, NP 06/12/23 2134

## 2023-06-12 NOTE — ED Triage Notes (Signed)
Pt presents with a cough, runny nose, chest congestion and SOB since yesterday. Pt has taken DayQuil for his symptoms.

## 2023-06-12 NOTE — Discharge Instructions (Addendum)
Your COVID flu and strep are all negative.  Rest, push fluids. Most likely you have a viral illness: no antibiotic is indicated at this time, May treat with OTC meds of choice. Make sure to drink plenty of fluids to stay hydrated(gatorade, water, popsicles,jello,etc), avoid caffeine products. Follow up with PCP. Return as needed.

## 2023-10-11 ENCOUNTER — Ambulatory Visit: Payer: Self-pay

## 2023-10-11 DIAGNOSIS — Z111 Encounter for screening for respiratory tuberculosis: Secondary | ICD-10-CM

## 2023-10-11 NOTE — Progress Notes (Signed)
 In nurse clinic for PPDR (placed with [facility]). Patient provided administration information. PPDR read as followed: 0mm - negative. Copy of administration sent for scanning.   Clare Critchley, RN
